# Patient Record
Sex: Male | Born: 1960 | Race: Black or African American | Hispanic: No | Marital: Single | Smoking: Never smoker
Health system: Southern US, Community
[De-identification: ages and names within clinical notes are randomized; demographics above are authoritative.]

---

## 2001-03-30 ENCOUNTER — Ambulatory Visit (HOSPITAL_COMMUNITY): Admission: RE | Admit: 2001-03-30 | Discharge: 2001-03-30 | Payer: Self-pay | Admitting: Family Medicine

## 2001-03-30 ENCOUNTER — Encounter: Payer: Self-pay | Admitting: Family Medicine

## 2012-12-08 ENCOUNTER — Encounter (INDEPENDENT_AMBULATORY_CARE_PROVIDER_SITE_OTHER): Payer: Self-pay | Admitting: *Deleted

## 2016-02-22 DIAGNOSIS — E785 Hyperlipidemia, unspecified: Secondary | ICD-10-CM | POA: Diagnosis not present

## 2016-02-22 DIAGNOSIS — R7301 Impaired fasting glucose: Secondary | ICD-10-CM | POA: Diagnosis not present

## 2016-02-22 DIAGNOSIS — Z008 Encounter for other general examination: Secondary | ICD-10-CM | POA: Diagnosis not present

## 2016-02-22 DIAGNOSIS — Z1389 Encounter for screening for other disorder: Secondary | ICD-10-CM | POA: Diagnosis not present

## 2016-06-12 DIAGNOSIS — Z23 Encounter for immunization: Secondary | ICD-10-CM | POA: Diagnosis not present

## 2016-07-16 DIAGNOSIS — Z008 Encounter for other general examination: Secondary | ICD-10-CM | POA: Diagnosis not present

## 2016-07-16 DIAGNOSIS — E785 Hyperlipidemia, unspecified: Secondary | ICD-10-CM | POA: Diagnosis not present

## 2016-07-16 DIAGNOSIS — R7301 Impaired fasting glucose: Secondary | ICD-10-CM | POA: Diagnosis not present

## 2016-07-16 DIAGNOSIS — Z7189 Other specified counseling: Secondary | ICD-10-CM | POA: Diagnosis not present

## 2016-10-01 DIAGNOSIS — E6609 Other obesity due to excess calories: Secondary | ICD-10-CM | POA: Diagnosis not present

## 2016-10-01 DIAGNOSIS — Z6832 Body mass index (BMI) 32.0-32.9, adult: Secondary | ICD-10-CM | POA: Diagnosis not present

## 2016-10-01 DIAGNOSIS — Z1389 Encounter for screening for other disorder: Secondary | ICD-10-CM | POA: Diagnosis not present

## 2016-10-01 DIAGNOSIS — Z0000 Encounter for general adult medical examination without abnormal findings: Secondary | ICD-10-CM | POA: Diagnosis not present

## 2016-10-01 DIAGNOSIS — Z23 Encounter for immunization: Secondary | ICD-10-CM | POA: Diagnosis not present

## 2016-11-25 DIAGNOSIS — R972 Elevated prostate specific antigen [PSA]: Secondary | ICD-10-CM | POA: Diagnosis not present

## 2016-12-26 DIAGNOSIS — E785 Hyperlipidemia, unspecified: Secondary | ICD-10-CM | POA: Diagnosis not present

## 2016-12-26 DIAGNOSIS — Z008 Encounter for other general examination: Secondary | ICD-10-CM | POA: Diagnosis not present

## 2016-12-26 DIAGNOSIS — Z719 Counseling, unspecified: Secondary | ICD-10-CM | POA: Diagnosis not present

## 2016-12-26 DIAGNOSIS — R7301 Impaired fasting glucose: Secondary | ICD-10-CM | POA: Diagnosis not present

## 2016-12-26 DIAGNOSIS — Z7189 Other specified counseling: Secondary | ICD-10-CM | POA: Diagnosis not present

## 2016-12-26 DIAGNOSIS — Z1389 Encounter for screening for other disorder: Secondary | ICD-10-CM | POA: Diagnosis not present

## 2017-07-22 DIAGNOSIS — E785 Hyperlipidemia, unspecified: Secondary | ICD-10-CM | POA: Diagnosis not present

## 2017-07-22 DIAGNOSIS — Z719 Counseling, unspecified: Secondary | ICD-10-CM | POA: Diagnosis not present

## 2017-07-22 DIAGNOSIS — Z7189 Other specified counseling: Secondary | ICD-10-CM | POA: Diagnosis not present

## 2017-07-22 DIAGNOSIS — R7301 Impaired fasting glucose: Secondary | ICD-10-CM | POA: Diagnosis not present

## 2017-07-22 DIAGNOSIS — Z008 Encounter for other general examination: Secondary | ICD-10-CM | POA: Diagnosis not present

## 2017-09-09 ENCOUNTER — Encounter: Payer: Self-pay | Admitting: Gastroenterology

## 2017-09-30 ENCOUNTER — Ambulatory Visit (INDEPENDENT_AMBULATORY_CARE_PROVIDER_SITE_OTHER): Payer: Self-pay

## 2017-09-30 DIAGNOSIS — Z1211 Encounter for screening for malignant neoplasm of colon: Secondary | ICD-10-CM

## 2017-09-30 MED ORDER — PEG 3350-KCL-NA BICARB-NACL 420 G PO SOLR: 4000.0000 mL | ORAL | 0 refills | Status: DC

## 2017-09-30 NOTE — Progress Notes (Signed)
Appropriate.

## 2017-09-30 NOTE — Progress Notes (Signed)
Gastroenterology Pre-Procedure Review  Request Date:09/30/17 Requesting Physician: Dr.Golding  PATIENT REVIEW QUESTIONS: The patient responded to the following health history questions as indicated:    1. Diabetes Melitis: no 2. Joint replacements in the past 12 months: no 3. Major health problems in the past 3 months: no 4. Has an artificial valve or MVP: no 5. Has a defibrillator: no 6. Has been advised in past to take antibiotics in advance of a procedure like teeth cleaning: no 7. Family history of colon cancer: no  8. Alcohol Use: yes (socially) 9. History of sleep apnea: no  10. History of coronary artery or other vascular stents placed within the last 12 months: no 11. History of any prior anesthesia complications: no    MEDICATIONS & ALLERGIES:    Patient reports the following regarding taking any blood thinners:   Plavix? no Aspirin? no Coumadin? no Brilinta? no Xarelto? no Eliquis? no Pradaxa? no Savaysa? no Effient? no  Patient confirms/reports the following medications:  Current Outpatient Medications  Medication Sig Dispense Refill  . Multiple Vitamin (MULTIVITAMIN) tablet Take 1 tablet by mouth daily.    . Omega-3 Fatty Acids (FISH OIL) 1000 MG CAPS Take by mouth.     No current facility-administered medications for this visit.     Patient confirms/reports the following allergies:  No Known Allergies  No orders of the defined types were placed in this encounter.   AUTHORIZATION INFORMATION Primary Insurance: BCBS [place]  ID #: [id number] Pre-Cert / Josem Kaufmann required: no   SCHEDULE INFORMATION: Procedure has been scheduled as follows:  Date: 11/05/17, Time: 9:30 Location: [facility] Dr.Rourk  This Gastroenterology Pre-Precedure Review Form is being routed to the following provider(s): Roseanne Kaufman NP

## 2017-09-30 NOTE — Patient Instructions (Signed)
Devin Bradley   04-05-1961 MRN: [id number]    Procedure Date: 11/05/17 Time to register: 8:30 Place to register: [facility] Procedure Time: 9:30 Scheduled provider: [facility] COLONOSCOPY WITH TRI-LYTE SPLIT PREP  Please notify us immediately if you are diabetic, take iron supplements, or if you are on Coumadin or any other blood thinners.     You will need to purchase 1 fleet enema and 1 box of Bisacodyl '5mg'$  tablets.   2 DAYS BEFORE PROCEDURE:  DATE: 11/03/17   DAY: Monday Begin clear liquid diet AFTER your lunch meal. NO SOLID FOODS after this point.  1 DAY BEFORE PROCEDURE:  DATE: 11/04/17   DAY: Tuesday Continue clear liquids the entire day - NO SOLID FOOD.    At 2:00 pm:  Take 2 Bisacodyl tablets.   At 4:00pm:  Start drinking your solution. Make sure you mix well per instructions on the bottle. Try to drink 1 (one) 8 ounce glass every 10-15 minutes until you have consumed HALF the jug. You should complete by 6:00pm.You must keep the left over solution refrigerated until completed next day.  Continue clear liquids. You must drink plenty of clear liquids to prevent dehyration and kidney failure. Nothing to eat or drink after midnight.  EXCEPTION: If you take medications for your heart, blood pressure or breathing, you may take these medications with a small amount of clear liquid.    DAY OF PROCEDURE:   DATE: 11/05/17  DAY: Wednesday   Five hours before your procedure time @ 4:30am:  Finish remaining amout of bowel prep, drinking 1 (one) 8 ounce glass every 10-15 minutes until complete. You have two hours to consume remaining prep.   Three hours before your procedure time '@6'$ :30am:  Nothing by mouth.   At least one hour before going to the hospital:  Give yourself one Fleet enema. You may take your morning medications with sip of water unless we have instructed otherwise.      Please see below for Dietary Information.  CLEAR LIQUIDS  INCLUDE:  Water Jello (NOT red in color)   Ice Popsicles (NOT red in color)   Tea (sugar ok, no milk/cream) Powdered fruit flavored drinks  Coffee (sugar ok, no milk/cream) Gatorade/ Lemonade/ Kool-Aid  (NOT red in color)   Juice: apple, white grape, white cranberry Soft drinks  Clear bullion, consomme, broth (fat free beef/chicken/vegetable)  Carbonated beverages (any kind)  Strained chicken noodle soup Hard Candy   Remember: Clear liquids are liquids that will allow you to see your fingers on the other side of a clear glass. Be sure liquids are NOT red in color, and not cloudy, but CLEAR.  DO NOT EAT OR DRINK ANY OF THE FOLLOWING:  Dairy products of any kind   Cranberry juice Tomato juice / V8 juice   Grapefruit juice Orange juice     Red grape juice  Do not eat any solid foods, including such foods as: cereal, oatmeal, yogurt, fruits, vegetables, creamed soups, eggs, bread, crackers, pureed foods in a blender, etc.   HELPFUL HINTS FOR DRINKING PREP SOLUTION:   Make sure prep is extremely cold. Mix and refrigerate the the morning of the prep. You may also put in the freezer.   You may try mixing some Crystal Light or Country Time Lemonade if you prefer. Mix in small amounts; add more if necessary.  Try drinking through a straw  Rinse mouth with water or a mouthwash between glasses, to remove after-taste.  Try sipping on a cold beverage /ice/ popsicles between glasses of prep.  Place a piece of sugar-free hard candy in mouth between glasses.  If you become nauseated, try consuming smaller amounts, or stretch out the time between glasses. Stop for 30-60 minutes, then slowly start back drinking.        OTHER INSTRUCTIONS  You will need a responsible adult at least 56 years of age to accompany you and drive you home. This person must remain in the waiting room during your procedure. The hospital will cancel your procedure if you do not have a responsible adult with you.    1. Wear loose fitting clothing that is easily removed. 2. Leave jewelry and other valuables at home.  3. Remove all body piercing jewelry and leave at home. 4. Total time from sign-in until discharge is approximately 2-3 hours. 5. You should go home directly after your procedure and rest. You can resume normal activities the day after your procedure. 6. The day of your procedure you should not:  Drive  Make legal decisions  Operate machinery  Drink alcohol  Return to work   You may call the office (Dept: [phone]) before 5:00pm, or page the doctor on call [phone]) after 5:00pm, for further instructions, if necessary.   Insurance Information YOU WILL NEED TO CHECK WITH YOUR INSURANCE COMPANY FOR THE BENEFITS OF COVERAGE YOU HAVE FOR THIS PROCEDURE.  UNFORTUNATELY, NOT ALL INSURANCE COMPANIES HAVE BENEFITS TO COVER ALL OR PART OF THESE TYPES OF PROCEDURES.  IT IS YOUR RESPONSIBILITY TO CHECK YOUR BENEFITS, HOWEVER, WE WILL BE GLAD TO ASSIST YOU WITH ANY CODES YOUR INSURANCE COMPANY MAY NEED.    PLEASE NOTE THAT MOST INSURANCE COMPANIES WILL NOT COVER A SCREENING COLONOSCOPY FOR PEOPLE UNDER THE AGE OF 50  IF YOU HAVE BCBS INSURANCE, YOU MAY HAVE BENEFITS FOR A SCREENING COLONOSCOPY BUT IF POLYPS ARE FOUND THE DIAGNOSIS WILL CHANGE AND THEN YOU MAY HAVE A DEDUCTIBLE THAT WILL NEED TO BE MET. SO PLEASE MAKE SURE YOU CHECK YOUR BENEFITS FOR A SCREENING COLONOSCOPY AS WELL AS A DIAGNOSTIC COLONOSCOPY.

## 2017-11-04 ENCOUNTER — Telehealth: Payer: Self-pay | Admitting: Internal Medicine

## 2017-11-04 NOTE — Telephone Encounter (Signed)
Please call patient, he has a procedure tomorrow and stated he ate a granola bar

## 2017-11-04 NOTE — Telephone Encounter (Signed)
Pt is aware. He said that was the only thing other than clear liquids that he has had. He said he understood his instructions and would follow them exactly tonight. He will not have any more solid foods.

## 2017-11-04 NOTE — Telephone Encounter (Signed)
Tried to call pt- NA- LMOM 

## 2017-11-04 NOTE — Telephone Encounter (Signed)
Called patient but no answer. Patient was triaged by Almyra Free. Does he need to reschedule his TCS Almyra Free?

## 2017-11-04 NOTE — Telephone Encounter (Signed)
Per RMR- as long as pt does not eat any more solid foods and follows his prep exactly, he will still be able to do his procedure tomorrow.

## 2017-11-05 ENCOUNTER — Encounter (HOSPITAL_COMMUNITY): Payer: Self-pay | Admitting: *Deleted

## 2017-11-05 ENCOUNTER — Encounter (HOSPITAL_COMMUNITY)
Admission: RE | Disposition: A | Discharge status: Home / Self Care | Payer: Self-pay | Source: Ambulatory Visit | Attending: Internal Medicine

## 2017-11-05 ENCOUNTER — Ambulatory Visit (HOSPITAL_COMMUNITY)
Admission: RE | Admit: 2017-11-05 | Discharge: 2017-11-05 | Disposition: A | Discharge status: Home / Self Care | Payer: BLUE CROSS/BLUE SHIELD | Source: Ambulatory Visit | Attending: Internal Medicine | Admitting: Internal Medicine

## 2017-11-05 ENCOUNTER — Other Ambulatory Visit: Payer: Self-pay

## 2017-11-05 DIAGNOSIS — D125 Benign neoplasm of sigmoid colon: Secondary | ICD-10-CM | POA: Insufficient documentation

## 2017-11-05 DIAGNOSIS — Z1211 Encounter for screening for malignant neoplasm of colon: Secondary | ICD-10-CM | POA: Insufficient documentation

## 2017-11-05 DIAGNOSIS — K5730 Diverticulosis of large intestine without perforation or abscess without bleeding: Secondary | ICD-10-CM | POA: Insufficient documentation

## 2017-11-05 DIAGNOSIS — Z1212 Encounter for screening for malignant neoplasm of rectum: Secondary | ICD-10-CM | POA: Diagnosis not present

## 2017-11-05 HISTORY — PX: COLONOSCOPY: SHX5424

## 2017-11-05 HISTORY — PX: POLYPECTOMY: SHX5525

## 2017-11-05 SURGERY — COLONOSCOPY
Anesthesia: Moderate Sedation

## 2017-11-05 MED ORDER — SODIUM CHLORIDE 0.9 % IV SOLN
INTRAVENOUS | Status: DC
  Administered 2017-11-05: 1000 mL via INTRAVENOUS

## 2017-11-05 MED ORDER — MEPERIDINE HCL 100 MG/ML IJ SOLN
INTRAMUSCULAR | Status: DC | PRN
  Administered 2017-11-05 (×2): 50 mg

## 2017-11-05 MED ORDER — ONDANSETRON HCL 4 MG/2ML IJ SOLN
INTRAMUSCULAR | Status: AC
  Filled 2017-11-05: qty 2

## 2017-11-05 MED ORDER — STERILE WATER FOR IRRIGATION IR SOLN
Status: DC | PRN
  Administered 2017-11-05: 09:00:00

## 2017-11-05 MED ORDER — MIDAZOLAM HCL 5 MG/5ML IJ SOLN
INTRAMUSCULAR | Status: AC
  Filled 2017-11-05: qty 10

## 2017-11-05 MED ORDER — ONDANSETRON HCL 4 MG/2ML IJ SOLN
INTRAMUSCULAR | Status: DC | PRN
  Administered 2017-11-05: 4 mg via INTRAVENOUS

## 2017-11-05 MED ORDER — MEPERIDINE HCL 100 MG/ML IJ SOLN
INTRAMUSCULAR | Status: AC
  Filled 2017-11-05: qty 2

## 2017-11-05 MED ORDER — MIDAZOLAM HCL 5 MG/5ML IJ SOLN
INTRAMUSCULAR | Status: DC | PRN
  Administered 2017-11-05: 2 mg via INTRAVENOUS
  Administered 2017-11-05: 1 mg via INTRAVENOUS
  Administered 2017-11-05: 2 mg via INTRAVENOUS

## 2017-11-05 NOTE — H&P (Signed)
@  PRFF@   Primary Care Physician:  Sharilyn Sites, MD Primary Gastroenterologist:  Dr. Gala Romney  Pre-Procedure History & Physical: HPI:  Devin Bradley is a 57 y.o. male is here for a screening colonoscopy. No bowel symptoms. No family history of colon cancer. A prior colonoscopy.  History reviewed. No pertinent past medical history.  History reviewed. No pertinent surgical history.  Prior to Admission medications   Medication Sig Start Date End Date Taking? Authorizing Provider  Multiple Vitamin (MULTIVITAMIN) tablet Take 1 tablet by mouth daily.   Yes [provider]  Omega-3 Fatty Acids (FISH OIL) 1000 MG CAPS Take 1 capsule by mouth daily.    Yes [provider]    Allergies as of 09/30/2017  . (No Known Allergies)    Family History  Problem Relation Age of Onset  . Cancer Father   . Cancer Sister   . Cancer Brother     Social History   Socioeconomic History  . Marital status: Single    Spouse name: Not on file  . Number of children: Not on file  . Years of education: Not on file  . Highest education level: Not on file  Social Needs  . Financial resource strain: Not on file  . Food insecurity - worry: Not on file  . Food insecurity - inability: Not on file  . Transportation needs - medical: Not on file  . Transportation needs - non-medical: Not on file  Occupational History  . Not on file  Tobacco Use  . Smoking status: Never Smoker  . Smokeless tobacco: Never Used  Substance and Sexual Activity  . Alcohol use: Yes    Comment: occasional wine  . Drug use: No  . Sexual activity: Not on file  Other Topics Concern  . Not on file  Social History Narrative  . Not on file    Review of Systems: See HPI, otherwise negative ROS  Physical Exam: BP (!) 141/88   Pulse 78   Temp (!) 97.5 F (36.4 C) (Oral)   Resp 16   Ht 6' 3.5" (1.918 m)   Wt 250 lb (113.4 kg)   SpO2 100%   BMI 30.84 kg/m  General:   Alert,  Well-developed, well-nourished,  pleasant and cooperative in NAD Neck:  Supple; no masses or thyromegaly. Lungs:  Clear throughout to auscultation.   No wheezes, crackles, or rhonchi. No acute distress. Heart:  Regular rate and rhythm; no murmurs, clicks, rubs,  or gallops. Abdomen:  Soft, nontender and nondistended. No masses, hepatosplenomegaly or hernias noted. Normal bowel sounds, without guarding, and without rebound.    Impression/Plan: Devin Bradley is now here to undergo a screening colonoscopy.  First ever average risk screening examination.  Risks, benefits, limitations, imponderables and alternatives regarding colonoscopy have been reviewed with the patient. Questions have been answered. All parties agreeable.     Notice:  This dictation was prepared with Dragon dictation along with smaller phrase technology. Any transcriptional errors that result from this process are unintentional and may not be corrected upon review.

## 2017-11-05 NOTE — Discharge Instructions (Signed)
°Colonoscopy °Discharge Instructions ° °Read the instructions outlined below and refer to this sheet in the next few weeks. These discharge instructions provide you with general information on caring for yourself after you leave the hospital. Your doctor may also give you specific instructions. While your treatment has been planned according to the most current medical practices available, unavoidable complications occasionally occur. If you have any problems or questions after discharge, call Dr. Rourk at [phone]. °ACTIVITY °· You may resume your regular activity, but move at a slower pace for the next 24 hours.  °· Take frequent rest periods for the next 24 hours.  °· Walking will help get rid of the air and reduce the bloated feeling in your belly (abdomen).  °· No driving for 24 hours (because of the medicine (anesthesia) used during the test).   °· Do not sign any important legal documents or operate any machinery for 24 hours (because of the anesthesia used during the test).  °NUTRITION °· Drink plenty of fluids.  °· You may resume your normal diet as instructed by your doctor.  °· Begin with a light meal and progress to your normal diet. Heavy or fried foods are harder to digest and may make you feel sick to your stomach (nauseated).  °· Avoid alcoholic beverages for 24 hours or as instructed.  °MEDICATIONS °· You may resume your normal medications unless your doctor tells you otherwise.  °WHAT YOU CAN EXPECT TODAY °· Some feelings of bloating in the abdomen.  °· Passage of more gas than usual.  °· Spotting of blood in your stool or on the toilet paper.  °IF YOU HAD POLYPS REMOVED DURING THE COLONOSCOPY: °· No aspirin products for 7 days or as instructed.  °· No alcohol for 7 days or as instructed.  °· Eat a soft diet for the next 24 hours.  °FINDING OUT THE RESULTS OF YOUR TEST °Not all test results are available during your visit. If your test results are not back during the visit, make an appointment  with your caregiver to find out the results. Do not assume everything is normal if you have not heard from your caregiver or the medical facility. It is important for you to follow up on all of your test results.  °SEEK IMMEDIATE MEDICAL ATTENTION IF: °· You have more than a spotting of blood in your stool.  °· Your belly is swollen (abdominal distention).  °· You are nauseated or vomiting.  °· You have a temperature over 101.  °· You have abdominal pain or discomfort that is severe or gets worse throughout the day.  ° ° °Colon polyp and diverticulosis information provided ° °Further recommendations to follow pending review of pathology report ° ° °Colon Polyps °Polyps are tissue growths inside the body. Polyps can grow in many places, including the large intestine (colon). A polyp may be a round bump or a mushroom-shaped growth. You could have one polyp or several. °Most colon polyps are noncancerous (benign). However, some colon polyps can become cancerous over time. °What are the causes? °The exact cause of colon polyps is not known. °What increases the risk? °This condition is more likely to develop in people who: °· Have a family history of colon cancer or colon polyps. °· Are older than 50 or older than 45 if they are African American. °· Have inflammatory bowel disease, such as ulcerative colitis or Crohn disease. °· Are overweight. °· Smoke cigarettes. °· Do not get enough exercise. °· Drink too   much alcohol. °· Eat a diet that is: °? High in fat and red meat. °? Low in fiber. °· Had childhood cancer that was treated with abdominal radiation. ° °What are the signs or symptoms? °Most polyps do not cause symptoms. If you have symptoms, they may include: °· Blood coming from your rectum when having a bowel movement. °· Blood in your stool. The stool may look dark red or black. °· A change in bowel habits, such as constipation or diarrhea. ° °How is this diagnosed? °This condition is diagnosed with a  colonoscopy. This is a procedure that uses a lighted, flexible scope to look at the inside of your colon. °How is this treated? °Treatment for this condition involves removing any polyps that are found. Those polyps will then be tested for cancer. If cancer is found, your health care provider will talk to you about options for colon cancer treatment. °Follow these instructions at home: °Diet °· Eat plenty of fiber, such as fruits, vegetables, and whole grains. °· Eat foods that are high in calcium and vitamin D, such as milk, cheese, yogurt, eggs, liver, fish, and broccoli. °· Limit foods high in fat, red meats, and processed meats, such as hot dogs, sausage, bacon, and lunch meats. °· Maintain a healthy weight, or lose weight if recommended by your health care provider. °General instructions °· Do not smoke cigarettes. °· Do not drink alcohol excessively. °· Keep all follow-up visits as told by your health care provider. This is important. This includes keeping regularly scheduled colonoscopies. Talk to your health care provider about when you need a colonoscopy. °· Exercise every day or as told by your health care provider. °Contact a health care provider if: °· You have new or worsening bleeding during a bowel movement. °· You have new or increased blood in your stool. °· You have a change in bowel habits. °· You unexpectedly lose weight. °This information is not intended to replace advice given to you by your health care provider. Make sure you discuss any questions you have with your health care provider. °Document Released: 05/15/2004 Document Revised: 01/25/2016 Document Reviewed: 07/10/2015 °Elsevier Interactive Patient Education © 2018 [employer] ° ° °Diverticulosis °Diverticulosis is a condition that develops when small pouches (diverticula) form in the wall of the large intestine (colon). The colon is where water is absorbed and stool is formed. The pouches form when the inside layer of the colon  pushes through weak spots in the outer layers of the colon. You may have a few pouches or many of them. °What are the causes? °The cause of this condition is not known. °What increases the risk? °The following factors may make you more likely to develop this condition: °· Being older than age 60. Your risk for this condition increases with age. Diverticulosis is rare among people younger than age 30. By age 80, many people have it. °· Eating a low-fiber diet. °· Having frequent constipation. °· Being overweight. °· Not getting enough exercise. °· Smoking. °· Taking over-the-counter pain medicines, like aspirin and ibuprofen. °· Having a family history of diverticulosis. ° °What are the signs or symptoms? °In most people, there are no symptoms of this condition. If you do have symptoms, they may include: °· Bloating. °· Cramps in the abdomen. °· Constipation or diarrhea. °· Pain in the lower left side of the abdomen. ° °How is this diagnosed? °This condition is most often diagnosed during an exam for other colon problems. Because diverticulosis usually has no   symptoms, it often cannot be diagnosed independently. This condition may be diagnosed by: °· Using a flexible scope to examine the colon (colonoscopy). °· Taking an X-ray of the colon after dye has been put into the colon (barium enema). °· Doing a CT scan. ° °How is this treated? °You may not need treatment for this condition if you have never developed an infection related to diverticulosis. If you have had an infection before, treatment may include: °· Eating a high-fiber diet. This may include eating more fruits, vegetables, and grains. °· Taking a fiber supplement. °· Taking a live bacteria supplement (probiotic). °· Taking medicine to relax your colon. °· Taking antibiotic medicines. ° °Follow these instructions at home: °· Drink 6-8 glasses of water or more each day to prevent constipation. °· Try not to strain when you have a bowel movement. °· If you  have had an infection before: °? Eat more fiber as directed by your health care provider or your diet and nutrition specialist (dietitian). °? Take a fiber supplement or probiotic, if your health care provider approves. °· Take over-the-counter and prescription medicines only as told by your health care provider. °· If you were prescribed an antibiotic, take it as told by your health care provider. Do not stop taking the antibiotic even if you start to feel better. °· Keep all follow-up visits as told by your health care provider. This is important. °Contact a health care provider if: °· You have pain in your abdomen. °· You have bloating. °· You have cramps. °· You have not had a bowel movement in 3 days. °Get help right away if: °· Your pain gets worse. °· Your bloating becomes very bad. °· You have a fever or chills, and your symptoms suddenly get worse. °· You vomit. °· You have bowel movements that are bloody or black. °· You have bleeding from your rectum. °Summary °· Diverticulosis is a condition that develops when small pouches (diverticula) form in the wall of the large intestine (colon). °· You may have a few pouches or many of them. °· This condition is most often diagnosed during an exam for other colon problems. °· If you have had an infection related to diverticulosis, treatment may include increasing the fiber in your diet, taking supplements, or taking medicines. °This information is not intended to replace advice given to you by your health care provider. Make sure you discuss any questions you have with your health care provider. °Document Released: 05/16/2004 Document Revised: 07/08/2016 Document Reviewed: 07/08/2016 °Elsevier Interactive Patient Education © 2017 [employer] ° °

## 2017-11-05 NOTE — Op Note (Signed)
[facility] Patient Name: Devin Bradley Procedure Date: 11/05/2017 8:47 AM MRN: [id number] Date of Birth: 01-03-1961 Attending MD: Norvel Richards , MD CSN: [id number] Age: 57 Admit Type: Outpatient Procedure:                Colonoscopy Indications:              Screening for colorectal malignant neoplasm Providers:                Norvel Richards, MD, Lurline Del, RN, Aram Candela Referring MD:              Medicines:                Midazolam 5 mg IV, Meperidine 765 mg IV Complications:            No immediate complications. Estimated Blood Loss:     Estimated blood loss was minimal. Procedure:                Pre-Anesthesia Assessment:                           - Prior to the procedure, a History and Physical                            was performed, and patient medications and                            allergies were reviewed. The patient's tolerance of                            previous anesthesia was also reviewed. The risks                            and benefits of the procedure and the sedation                            options and risks were discussed with the patient.                            All questions were answered, and informed consent                            was obtained. Prior Anticoagulants: The patient has                            taken no previous anticoagulant or antiplatelet                            agents. ASA Grade Assessment: II - A patient with                            mild systemic disease. After reviewing the risks  and benefits, the patient was deemed in                            satisfactory condition to undergo the procedure.                           After obtaining informed consent, the colonoscope                            was passed under direct vision. Throughout the                            procedure, the patient's blood pressure, pulse, and   oxygen saturations were monitored continuously. The                            [id number]-i10L [id number]) scope was introduced through                            the anus and advanced to the the cecum, identified                            by appendiceal orifice and ileocecal valve. The                            colonoscopy was performed without difficulty. The                            patient tolerated the procedure well. The quality                            of the bowel preparation was adequate. The                            ileocecal valve, appendiceal orifice, and rectum                            were photographed. The entire colon was well                            visualized. Scope In: 9:13:55 AM Scope Out: 9:27:14 AM Scope Withdrawal Time: 0 hours 9 minutes 2 seconds  Total Procedure Duration: 0 hours 13 minutes 19 seconds  Findings:      The perianal and digital rectal examinations were normal.      A few small-mouthed diverticula were found in the sigmoid colon.      A 6 mm polyp was found in the sigmoid colon. The polyp was pedunculated.       The polyp was removed with a cold snare. Resection and retrieval were       complete. Estimated blood loss was minimal.      The entire examined colon appeared normal on direct and retroflexion       views. Impression:               - Diverticulosis in the sigmoid colon.                           -  One 6 mm polyp in the sigmoid colon, removed with                            a cold snare. Resected and retrieved.                           - The entire examined colon is normal on direct and                            retroflexion views. Moderate Sedation:      Moderate (conscious) sedation was administered by the endoscopy nurse       and supervised by the endoscopist. The following parameters were       monitored: oxygen saturation, heart rate, blood pressure, respiratory       rate, EKG, adequacy of pulmonary ventilation, and response  to care. Recommendation:           - Patient has a contact number available for                            emergencies. The signs and symptoms of potential                            delayed complications were discussed with the                            patient. Return to normal activities tomorrow.                            Written discharge instructions were provided to the                            patient.                           - Resume previous diet.                           - Continue present medications.                           - Await pathology results.                           - Repeat colonoscopy date to be determined after                            pending pathology results are reviewed for                            surveillance.                           - Return to GI clinic (date not yet determined). Procedure Code(s):        --- Professional ---  45385, Colonoscopy, flexible; with removal of                            tumor(s), polyp(s), or other lesion(s) by snare                            technique Diagnosis Code(s):        --- Professional ---                           Z12.11, Encounter for screening for malignant                            neoplasm of colon                           D12.5, Benign neoplasm of sigmoid colon                           K57.30, Diverticulosis of large intestine without                            perforation or abscess without bleeding CPT copyright 2016 American Medical Association. All rights reserved. The codes documented in this report are preliminary and upon coder review may  be revised to meet current compliance requirements. Cristopher Estimable. Olie Dibert, MD Norvel Richards, MD 11/05/2017 9:31:45 AM This report has been signed electronically. Number of Addenda: 0

## 2017-11-06 ENCOUNTER — Encounter: Payer: Self-pay | Admitting: Internal Medicine

## 2017-11-07 ENCOUNTER — Encounter (HOSPITAL_COMMUNITY): Payer: Self-pay | Admitting: Internal Medicine

## 2018-01-20 DIAGNOSIS — R7301 Impaired fasting glucose: Secondary | ICD-10-CM | POA: Diagnosis not present

## 2018-01-20 DIAGNOSIS — Z008 Encounter for other general examination: Secondary | ICD-10-CM | POA: Diagnosis not present

## 2018-01-20 DIAGNOSIS — Z719 Counseling, unspecified: Secondary | ICD-10-CM | POA: Diagnosis not present

## 2018-01-20 DIAGNOSIS — E785 Hyperlipidemia, unspecified: Secondary | ICD-10-CM | POA: Diagnosis not present

## 2018-01-21 DIAGNOSIS — Z1389 Encounter for screening for other disorder: Secondary | ICD-10-CM | POA: Diagnosis not present

## 2018-01-21 DIAGNOSIS — Z6834 Body mass index (BMI) 34.0-34.9, adult: Secondary | ICD-10-CM | POA: Diagnosis not present

## 2018-01-21 DIAGNOSIS — E6609 Other obesity due to excess calories: Secondary | ICD-10-CM | POA: Diagnosis not present

## 2018-01-21 DIAGNOSIS — Z0000 Encounter for general adult medical examination without abnormal findings: Secondary | ICD-10-CM | POA: Diagnosis not present

## 2018-06-08 DIAGNOSIS — Z23 Encounter for immunization: Secondary | ICD-10-CM | POA: Diagnosis not present

## 2018-06-08 DIAGNOSIS — Z6835 Body mass index (BMI) 35.0-35.9, adult: Secondary | ICD-10-CM | POA: Diagnosis not present

## 2018-06-08 DIAGNOSIS — R21 Rash and other nonspecific skin eruption: Secondary | ICD-10-CM | POA: Diagnosis not present

## 2018-06-08 DIAGNOSIS — Z1389 Encounter for screening for other disorder: Secondary | ICD-10-CM | POA: Diagnosis not present

## 2018-06-08 DIAGNOSIS — L299 Pruritus, unspecified: Secondary | ICD-10-CM | POA: Diagnosis not present

## 2018-07-16 DIAGNOSIS — Z008 Encounter for other general examination: Secondary | ICD-10-CM | POA: Diagnosis not present

## 2018-07-16 DIAGNOSIS — Z719 Counseling, unspecified: Secondary | ICD-10-CM | POA: Diagnosis not present

## 2018-07-16 DIAGNOSIS — E785 Hyperlipidemia, unspecified: Secondary | ICD-10-CM | POA: Diagnosis not present

## 2018-07-16 DIAGNOSIS — R7301 Impaired fasting glucose: Secondary | ICD-10-CM | POA: Diagnosis not present

## 2018-11-02 ENCOUNTER — Emergency Department (HOSPITAL_COMMUNITY)
Admission: EM | Admit: 2018-11-02 | Discharge: 2018-11-02 | Disposition: A | Discharge status: Home / Self Care | Payer: No Typology Code available for payment source | Attending: Emergency Medicine | Admitting: Emergency Medicine

## 2018-11-02 ENCOUNTER — Encounter (HOSPITAL_COMMUNITY): Payer: Self-pay | Admitting: Emergency Medicine

## 2018-11-02 ENCOUNTER — Ambulatory Visit: Payer: Self-pay | Admitting: Orthopedic Surgery

## 2018-11-02 ENCOUNTER — Emergency Department (HOSPITAL_COMMUNITY): Payer: No Typology Code available for payment source

## 2018-11-02 ENCOUNTER — Other Ambulatory Visit: Payer: Self-pay

## 2018-11-02 DIAGNOSIS — S42401S Unspecified fracture of lower end of right humerus, sequela: Secondary | ICD-10-CM | POA: Diagnosis not present

## 2018-11-02 DIAGNOSIS — Y939 Activity, unspecified: Secondary | ICD-10-CM | POA: Insufficient documentation

## 2018-11-02 DIAGNOSIS — S42495A Other nondisplaced fracture of lower end of left humerus, initial encounter for closed fracture: Secondary | ICD-10-CM | POA: Insufficient documentation

## 2018-11-02 DIAGNOSIS — Y990 Civilian activity done for income or pay: Secondary | ICD-10-CM | POA: Insufficient documentation

## 2018-11-02 DIAGNOSIS — W010XXA Fall on same level from slipping, tripping and stumbling without subsequent striking against object, initial encounter: Secondary | ICD-10-CM | POA: Diagnosis not present

## 2018-11-02 DIAGNOSIS — Z79899 Other long term (current) drug therapy: Secondary | ICD-10-CM | POA: Insufficient documentation

## 2018-11-02 DIAGNOSIS — R52 Pain, unspecified: Secondary | ICD-10-CM | POA: Diagnosis not present

## 2018-11-02 DIAGNOSIS — W19XXXA Unspecified fall, initial encounter: Secondary | ICD-10-CM | POA: Diagnosis not present

## 2018-11-02 DIAGNOSIS — Y929 Unspecified place or not applicable: Secondary | ICD-10-CM | POA: Insufficient documentation

## 2018-11-02 DIAGNOSIS — S59902A Unspecified injury of left elbow, initial encounter: Secondary | ICD-10-CM

## 2018-11-02 DIAGNOSIS — T148XXA Other injury of unspecified body region, initial encounter: Secondary | ICD-10-CM

## 2018-11-02 MED ORDER — HYDROCODONE-ACETAMINOPHEN 5-325 MG PO TABS: ORAL_TABLET | ORAL | 0 refills | Status: DC

## 2018-11-02 MED ORDER — OXYCODONE-ACETAMINOPHEN 5-325 MG PO TABS
1.0000 | ORAL_TABLET | Freq: Once | ORAL | Status: AC
  Administered 2018-11-02: 1 via ORAL
  Filled 2018-11-02: qty 1

## 2018-11-02 NOTE — Discharge Instructions (Addendum)
Elevate and apply ice packs on and off to your elbow.  Keep it in the sling.  Follow-up with Dr. Ruthe Mannan office on Wednesday, 11/04/2018 at 3 PM.

## 2018-11-02 NOTE — ED Triage Notes (Signed)
PT brought in by [facility] and states he fell at work this am at [employer] and landed with his arm in behind him. PT c/o left forearm and elbow pain upon arrival to ED.

## 2018-11-02 NOTE — ED Provider Notes (Signed)
[facility] EMERGENCY DEPARTMENT Provider Note   CSN: [id number] Arrival date & time: 11/02/18  1237    History   Chief Complaint Chief Complaint  Patient presents with  . Fall    HPI Devin Bradley is a 58 y.o. male.     HPI   Devin Bradley is a 58 y.o. male who presents to the Emergency Department complaining of sudden onset of left elbow pain and swelling after a mechanical fall that occurred at work.  He states this is a workers comp injury.  He describes a mechanical fall in which he slipped and fell with his left arm extended behind him.  He reports diffuse pain and swelling of the elbow and pain with flexion.  He denies head injury, LOC, neck or back pain, numbness or tingling of his fingers or wrist pain.  Right-hand dominant.  History reviewed. No pertinent past medical history.  There are no active problems to display for this patient.   Past Surgical History:  Procedure Laterality Date  . COLONOSCOPY N/A 11/05/2017   Procedure: COLONOSCOPY;  Surgeon: Daneil Dolin, MD;  [facility]: [facility];  Service: Endoscopy;  Laterality: N/A;  9:30  . POLYPECTOMY  11/05/2017   Procedure: POLYPECTOMY;  Surgeon: Daneil Dolin, MD;  Location: [facility];  Service: Endoscopy;;  sigmoid        Home Medications    Prior to Admission medications   Medication Sig Start Date End Date Taking? Authorizing Provider  Multiple Vitamin (MULTIVITAMIN) tablet Take 1 tablet by mouth daily.    [provider]  Omega-3 Fatty Acids (FISH OIL) 1000 MG CAPS Take 1 capsule by mouth daily.     [provider]    Family History Family History  Problem Relation Age of Onset  . Cancer Father   . Cancer Sister   . Cancer Brother     Social History Social History   Tobacco Use  . Smoking status: Never Smoker  . Smokeless tobacco: Never Used  Substance Use Topics  . Alcohol use: Yes    Comment: occasional wine  . Drug use: No     Allergies   Patient has no  known allergies.   Review of Systems Review of Systems  Constitutional: Negative for chills and fever.  Respiratory: Negative for shortness of breath.   Cardiovascular: Negative for chest pain.  Gastrointestinal: Negative for nausea and vomiting.  Musculoskeletal: Positive for arthralgias (Left elbow pain and swelling) and joint swelling. Negative for back pain and neck pain.  Skin: Negative for color change and wound.  Neurological: Negative for dizziness, weakness and numbness.     Physical Exam Updated Vital Signs BP (!) 158/91 (BP Location: Right Arm)   Pulse 74   Temp 98 F (36.7 C) (Oral)   Resp 18   Ht 6\' 4"  (1.93 m)   Wt 126.1 kg   SpO2 100%   BMI 33.84 kg/m   Physical Exam Vitals signs and nursing note reviewed.  Constitutional:      General: He is not in acute distress.    Appearance: Normal appearance.  HENT:     Head: Atraumatic.  Neck:     Musculoskeletal: Normal range of motion. No muscular tenderness.  Cardiovascular:     Rate and Rhythm: Normal rate and regular rhythm.     Pulses: Normal pulses.  Pulmonary:     Effort: Pulmonary effort is normal.     Breath sounds: Normal breath sounds.  Chest:  Chest wall: No tenderness.  Musculoskeletal:        General: Swelling, tenderness and signs of injury present.     Comments: Diffuse tenderness to palpation of the left elbow.  Moderate edema noted.  Patient holding arm in extension and guarding elbow with movement.  Bony deformity.  Left wrist and shoulder are nontender.  Grip strength is strong and symmetrical.  Skin:    General: Skin is warm.     Capillary Refill: Capillary refill takes less than 2 seconds.     Findings: No rash.  Neurological:     General: No focal deficit present.     Mental Status: He is alert.     Sensory: Sensation is intact. No sensory deficit.     Motor: No weakness.      ED Treatments / Results  Labs (all labs ordered are listed, but only abnormal results are  displayed) Labs Reviewed - No data to display  EKG None  Radiology Dg Elbow Complete Left  Result Date: 11/02/2018 CLINICAL DATA:  Fall.  Left elbow pain. EXAM: LEFT ELBOW - COMPLETE 3+ VIEW COMPARISON:  None. FINDINGS: Small linear bone fragment near the capitellum and radial head. Findings are suggestive for an avulsion fracture. There is some irregularity along the lateral aspect of the capitellum suggesting this could be the source of the fracture. No large joint effusion. The elbow is located. IMPRESSION: Evidence for an avulsion fracture near the capitellum and radial head. Suspect that the fracture is originating from the lateral aspect of the capitellum. Electronically Signed   By: Markus Daft M.D.   On: 11/02/2018 13:35    Procedures Procedures (including critical care time)  Medications Ordered in ED Medications  oxyCODONE-acetaminophen (PERCOCET/ROXICET) 5-325 MG per tablet 1 tablet (1 tablet Oral Given 11/02/18 1407)     Initial Impression / Assessment and Plan / ED Course  I have reviewed the triage vital signs and the nursing notes.  Pertinent labs & imaging results that were available during my care of the patient were reviewed by me and considered in my medical decision making (see chart for details).         X-rays show avulsion fracture of the elbow, likely originating from the capitellum.  Patient neurovascularly intact.  Compartments are soft.  Patient requesting follow-up with orthopedics locally and to see Dr. Aline Brochure.  1425 Dr. Aline Brochure is not on-call today,  I have spoken with Angie at Dr. Ruthe Mannan office, they will see patient for follow-up this Wednesday, 11/04/2018 at 3 PM. Patient placed in a sling, he agrees to elevate and ice.  Plans also discussed with [employer] nurse on staff.  Final Clinical Impressions(s) / ED Diagnoses   Final diagnoses:  Avulsion fracture  Injury of left elbow, initial encounter    ED Discharge Orders    None         Kem Parkinson, PA-C 11/02/18 1444    Elnora Morrison, MD 11/03/18 [phone]

## 2018-11-04 ENCOUNTER — Encounter: Payer: Self-pay | Admitting: Orthopedic Surgery

## 2018-11-04 ENCOUNTER — Ambulatory Visit (INDEPENDENT_AMBULATORY_CARE_PROVIDER_SITE_OTHER): Payer: No Typology Code available for payment source | Admitting: Orthopedic Surgery

## 2018-11-04 ENCOUNTER — Other Ambulatory Visit: Payer: Self-pay

## 2018-11-04 VITALS — BP 188/103 | HR 78 | Ht 75.0 in | Wt 275.0 lb

## 2018-11-04 DIAGNOSIS — S53442A Ulnar collateral ligament sprain of left elbow, initial encounter: Secondary | ICD-10-CM

## 2018-11-04 DIAGNOSIS — S42452A Displaced fracture of lateral condyle of left humerus, initial encounter for closed fracture: Secondary | ICD-10-CM

## 2018-11-04 MED ORDER — HYDROCODONE-ACETAMINOPHEN 5-325 MG PO TABS: 1.0000 | ORAL_TABLET | Freq: Four times a day (QID) | ORAL | 0 refills | Status: AC | PRN

## 2018-11-04 MED ORDER — IBUPROFEN 800 MG PO TABS: 800.0000 mg | ORAL_TABLET | Freq: Three times a day (TID) | ORAL | 0 refills | Status: DC | PRN

## 2018-11-04 NOTE — Patient Instructions (Signed)
NO USE LEFT ARM 3 WEEKS

## 2018-11-04 NOTE — Progress Notes (Signed)
NEW PATIENT OFFICE VISIT  Chief Complaint  Patient presents with  . Elbow Injury    left elbow injury 11/02/18 fell slipped at work ([employer])     58 year old male was at work landed on his left arm and elbow presented to the ER on 2 March complaining of left elbow pain x-ray showed a small avulsion fraction of the capitellum  He complains of left elbow pain and swelling medial lateral joint some loss of motion and his sling is not holding his arm well and he stopped using it.  He is on hydrocodone for pain  He has some swelling on the medial side a lot of bruising and some lateral pain  Mild discomfort currently initially had severe 10 out of 10 pain   Review of Systems  Constitutional: Negative for fever.  Skin: Negative.   Neurological: Negative for tingling and focal weakness.     History reviewed. No pertinent past medical history.  Past Surgical History:  Procedure Laterality Date  . COLONOSCOPY N/A 11/05/2017   Procedure: COLONOSCOPY;  Surgeon: Daneil Dolin, MD;  [facility];  Service: Endoscopy;  Laterality: N/A;  9:30  . POLYPECTOMY  11/05/2017   Procedure: POLYPECTOMY;  Surgeon: Daneil Dolin, MD;  [facility]: [facility];  Service: Endoscopy;;  sigmoid    Family History  Problem Relation Age of Onset  . Cancer Father   . Cancer Sister   . Cancer Brother    Social History   Tobacco Use  . Smoking status: Never Smoker  . Smokeless tobacco: Never Used  Substance Use Topics  . Alcohol use: Yes    Comment: occasional wine  . Drug use: No    No Known Allergies  Current Meds  Medication Sig  . [DISCONTINUED] HYDROcodone-acetaminophen (NORCO/VICODIN) 5-325 MG tablet Take one tab po q 4 hrs prn pain    BP (!) 188/103   Pulse 78   Ht 6\' 3"  (1.905 m)   Wt 275 lb (124.7 kg)   BMI 34.37 kg/m   Physical Exam Vitals signs reviewed.  Constitutional:      Appearance: Normal appearance. He is well-developed.  Skin:    General: Skin is warm and  dry.     Findings: No erythema.  Neurological:     Mental Status: He is alert and oriented to person, place, and time.  Psychiatric:        Mood and Affect: Mood and affect normal.     Ortho Exam  Right elbow no tenderness full range of motion feels very stable muscle tone and strength normal neurovascular intact skin clean  Left elbow bruising and ecchymosis over the medial collateral ligament and elbow with tenderness and quite a bit of swelling.  Range of motion is 5-1 20 medial collateral ligament stress tests are normal tenderness over the lateral joint line neurovascular exam is intact  MEDICAL DECISION SECTION  Xrays were done at Hospital  My independent reading of xrays:  Avulsion fracture capitellum  Encounter Diagnoses  Name Primary?  . Closed fracture of capitulum of left humerus, initial encounter Yes  . Elbow sprain, ulnar collateral ligament, left, initial encounter     PLAN: (Rx., injectx, surgery, frx, mri/ct) Recommend no lifting for 3 weeks Sling as needed for comfort Active range of motion 4 times a day out of the sling Ibuprofen 803 times a day Norco every 6 5 mg for breakthrough pain  Follow-up 3 weeks  Meds ordered this encounter  Medications  .  HYDROcodone-acetaminophen (NORCO) 5-325 MG tablet    Sig: Take 1 tablet by mouth every 6 (six) hours as needed for up to 7 days for moderate pain.    Dispense:  28 tablet    Refill:  0  . ibuprofen (ADVIL,MOTRIN) 800 MG tablet    Sig: Take 1 tablet (800 mg total) by mouth every 8 (eight) hours as needed.    Dispense:  90 tablet    Refill:  0    Arther Abbott, MD  11/04/2018 3:59 PM

## 2018-11-14 ENCOUNTER — Encounter: Payer: Self-pay | Admitting: Gynecology

## 2018-11-14 ENCOUNTER — Ambulatory Visit
Admission: EM | Admit: 2018-11-14 | Discharge: 2018-11-14 | Disposition: A | Discharge status: Home / Self Care | Payer: BLUE CROSS/BLUE SHIELD | Attending: Emergency Medicine | Admitting: Emergency Medicine

## 2018-11-14 ENCOUNTER — Other Ambulatory Visit: Payer: Self-pay

## 2018-11-14 DIAGNOSIS — J019 Acute sinusitis, unspecified: Secondary | ICD-10-CM

## 2018-11-14 MED ORDER — FLUTICASONE PROPIONATE 50 MCG/ACT NA SUSP: 2.0000 | Freq: Every day | NASAL | 0 refills | Status: AC

## 2018-11-14 NOTE — ED Provider Notes (Signed)
HPI  SUBJECTIVE:  Devin Bradley is a 58 y.o. male who presents with 3 to 4 days of nasal congestion, sinus pain and pressure.  He reports occasional sneezing but denies other allergy symptoms.  He denies rhinorrhea, fevers, upper dental pain, facial swelling.  No sore throat, postnasal drip.  Has an occasional cough.  He tried cough syrup and an unknown nasal spray and Mucinex without much improvement of symptoms.  He is not doing any saline nasal irrigation nor is he on any nasal steroids.  No alleviating factors.  Symptoms are worse at night and with going out into the cold air.  Past medical history negative for allergies, diabetes, hypertension, frequent sinusitis.  PMD: Sharilyn Sites, MD     History reviewed. No pertinent past medical history.  Past Surgical History:  Procedure Laterality Date  . COLONOSCOPY N/A 11/05/2017   Procedure: COLONOSCOPY;  Surgeon: Daneil Dolin, MD;  [facility]: [facility];  Service: Endoscopy;  Laterality: N/A;  9:30  . POLYPECTOMY  11/05/2017   Procedure: POLYPECTOMY;  Surgeon: Daneil Dolin, MD;  [facility];  Service: Endoscopy;;  sigmoid    Family History  Problem Relation Age of Onset  . Cancer Father   . Cancer Sister   . Cancer Brother     Social History   Tobacco Use  . Smoking status: Never Smoker  . Smokeless tobacco: Never Used  Substance Use Topics  . Alcohol use: Yes    Comment: occasional wine  . Drug use: No    No current facility-administered medications for this encounter.   Current Outpatient Medications:  .  ibuprofen (ADVIL,MOTRIN) 800 MG tablet, Take 1 tablet (800 mg total) by mouth every 8 (eight) hours as needed., Disp: 90 tablet, Rfl: 0 .  Multiple Vitamin (MULTIVITAMIN) tablet, Take 1 tablet by mouth daily., Disp: , Rfl:  .  fluticasone (FLONASE) 50 MCG/ACT nasal spray, Place 2 sprays into both nostrils daily., Disp: 16 g, Rfl: 0 .  Omega-3 Fatty Acids (FISH OIL) 1000 MG CAPS, Take 1 capsule by mouth  daily. , Disp: , Rfl:   No Known Allergies   ROS  As noted in HPI.   Physical Exam  BP (!) 149/97 (BP Location: Left Arm)   Pulse 69   Temp 98.4 F (36.9 C) (Oral)   Resp 18   Wt 124.7 kg   SpO2 100%   BMI 34.37 kg/m   Constitutional: Well developed, well nourished, no acute distress Eyes:  EOMI, conjunctiva normal bilaterally HENT: Normocephalic, atraumatic,mucus membranes moist.  Mucoid nasal congestion.  Extensively erythematous, swollen turbinates.  No maxillary, frontal sinus tenderness. Respiratory: Normal inspiratory effort Cardiovascular: Normal rate GI: nondistended skin: No rash, skin intact Musculoskeletal: no deformities Neurologic: Alert & oriented x 3, no focal neuro deficits Psychiatric: Speech and behavior appropriate   ED Course   Medications - No data to display  No orders of the defined types were placed in this encounter.   No results found for this or any previous visit (from the past 24 hour(s)). No results found.  ED Clinical Impression  Acute non-recurrent sinusitis, unspecified location   ED Assessment/Plan  Suspect viral sinusitis.  No clear indications for antibiotics at this time.  Home with saline nasal irrigation, Flonase, Mucinex D, Afrin at night for no more than 3 days.  May follow-up here or see his primary care physician if still sick in 5 days, double sickening, fevers above 102 and we can consider antibiotics.  Discussed  MDM, treatment plan, and plan for follow-up with patient.. patient agrees with plan.   Meds ordered this encounter  Medications  . fluticasone (FLONASE) 50 MCG/ACT nasal spray    Sig: Place 2 sprays into both nostrils daily.    Dispense:  16 g    Refill:  0    *This clinic note was created using [employer]. Therefore, there may be occasional mistakes despite careful proofreading.   ?   Melynda Ripple, MD 11/14/18 [phone]

## 2018-11-14 NOTE — ED Triage Notes (Signed)
Patient c/o sinus problem. Per patient with head and nasal congestion.Marland Kitchen

## 2018-11-14 NOTE — Discharge Instructions (Addendum)
Take the medication as written.  You may use Afrin because of the extent of your nasal congestion.  Use it at night only and do not use it for more than 3 days.  Start Mucinex-D to keep the mucous thin and to decongest you.  Return to the ER if you get worse, have a fever >100.4, or for any concerns. You may take 600 mg of motrin with 1 gram of tylenol up to 3-4 times a day as needed for pain. This is an effective combination for pain.  Most sinus infections are viral and do not need antibiotics unless you have a high fever, have had this for 10 days, or you get better and then get sick again. Use a NeilMed sinus rinse as often as you want to to reduce nasal congestion. Follow the directions on the box.   Go to www.goodrx.com to look up your medications. This will give you a list of where you can find your prescriptions at the most affordable prices. Or you can ask the pharmacist what the cash price is. This is frequently cheaper than going through insurance.

## 2018-11-25 ENCOUNTER — Encounter: Payer: Self-pay | Admitting: Orthopedic Surgery

## 2018-11-25 ENCOUNTER — Ambulatory Visit (INDEPENDENT_AMBULATORY_CARE_PROVIDER_SITE_OTHER): Payer: No Typology Code available for payment source | Admitting: Orthopedic Surgery

## 2018-11-25 ENCOUNTER — Other Ambulatory Visit: Payer: Self-pay

## 2018-11-25 VITALS — Temp 97.6°F | Ht 75.0 in | Wt 275.0 lb

## 2018-11-25 DIAGNOSIS — S42452D Displaced fracture of lateral condyle of left humerus, subsequent encounter for fracture with routine healing: Secondary | ICD-10-CM | POA: Diagnosis not present

## 2018-11-25 NOTE — Patient Instructions (Signed)
Return to work in 2 weeks   VIRTUAL VISIT IN 2 WEEKS (pm)

## 2018-11-25 NOTE — Progress Notes (Signed)
Chief Complaint  Patient presents with  . Elbow Injury    left elbow fracture 11/01/18   58 year old male was at work landed on his left arm and elbow presented to the ER on 2 March complaining of left elbow pain x-ray showed a small avulsion fraction of the capitellum   He complains of left elbow pain and swelling medial lateral joint some loss of motion and his sling is not holding his arm well and he stopped using it.  He is on hydrocodone for pain   He has some swelling on the medial side a lot of bruising and some lateral pain   Mild discomfort currently initially had severe 10 out of 10 pain  PLAN: (Rx., injectx, surgery, frx, mri/ct) Recommend no lifting for 3 weeks Sling as needed for comfort Active range of motion 4 times a day out of the sling Ibuprofen 800 times a day Norco every 6 5 mg for breakthrough pain   Follow-up 3 weeks       Meds ordered this encounter  Medications  . HYDROcodone-acetaminophen (NORCO) 5-325 MG tablet      Sig: Take 1 tablet by mouth every 6 (six) hours as needed for up to 7 days for moderate pain.      Dispense:  28 tablet      Refill:  0  . ibuprofen (ADVIL,MOTRIN) 800 MG tablet      Sig: Take 1 tablet (800 mg total) by mouth every 8 (eight) hours as needed.      Dispense:  90 tablet      Refill:  0   Courage is improving he has some tenderness and pain over his medial collateral ligament which was injured at the time of his capitellum fracture  His range of motion is 3 degrees of extension full flexion, he is tender over the medial collateral ligament he has a firm endpoint on stress testing at 30 and 0 degrees versus the right side  His pronation supination has returned to normal  He has some weakness with flexion  Recommend hand weight flexion exercises for strengthening  Virtual visit follow-up in 2 weeks with plan to return to work in 2 weeks.

## 2018-11-28 ENCOUNTER — Other Ambulatory Visit: Payer: Self-pay | Admitting: Orthopedic Surgery

## 2018-11-28 DIAGNOSIS — S53442A Ulnar collateral ligament sprain of left elbow, initial encounter: Secondary | ICD-10-CM

## 2018-11-28 DIAGNOSIS — S42452A Displaced fracture of lateral condyle of left humerus, initial encounter for closed fracture: Secondary | ICD-10-CM

## 2018-12-07 DIAGNOSIS — S42453D Displaced fracture of lateral condyle of unspecified humerus, subsequent encounter for fracture with routine healing: Secondary | ICD-10-CM | POA: Insufficient documentation

## 2018-12-09 ENCOUNTER — Ambulatory Visit (INDEPENDENT_AMBULATORY_CARE_PROVIDER_SITE_OTHER): Payer: No Typology Code available for payment source | Admitting: Orthopedic Surgery

## 2018-12-09 ENCOUNTER — Other Ambulatory Visit: Payer: Self-pay

## 2018-12-09 ENCOUNTER — Encounter: Payer: Self-pay | Admitting: Orthopedic Surgery

## 2018-12-09 VITALS — Ht 75.0 in | Wt 275.0 lb

## 2018-12-09 DIAGNOSIS — S42452D Displaced fracture of lateral condyle of left humerus, subsequent encounter for fracture with routine healing: Secondary | ICD-10-CM | POA: Diagnosis not present

## 2018-12-09 NOTE — Patient Instructions (Signed)
20th April RTW

## 2018-12-09 NOTE — Progress Notes (Signed)
Progress Note   Patient ID: JOVANNE RIGGENBACH, male   DOB: 1961/05/17, 58 y.o.   MRN: [id number]   Chief Complaint  Patient presents with  . Elbow Injury    left elbow fracture 11/01/18 improving     58 year old male had a Worker's Compensation related injury left elbow involving the medial collateral ligament and the capitellum which was fractured  He was treated with range of motion exercises ibuprofen and hydrocodone and rest  He is returned for his follow-up visit he has no complaints    Review of Systems  Neurological: Negative for tingling.     No Known Allergies   Ht 6\' 3"  (1.905 m)   Wt 275 lb (124.7 kg)   BMI 34.37 kg/m   Physical Exam Vitals signs and nursing note reviewed.  Constitutional:      Appearance: Normal appearance.  Musculoskeletal:       Arms:  Neurological:     Mental Status: He is alert and oriented to person, place, and time.  Psychiatric:        Mood and Affect: Mood normal.      Medical decisions:   Data  Imaging:   na  Encounter Diagnosis  Name Primary?  . Closed fracture of capitulum of left humerus with routine healing, subsequent encounter 11/01/18 Yes    PLAN:   Resume work on 21 December 2018 Full activity Released no further appointments needed    Arther Abbott, MD 12/09/2018 9:39 AM

## 2018-12-14 ENCOUNTER — Telehealth: Payer: Self-pay | Admitting: Orthopedic Surgery

## 2018-12-14 NOTE — Telephone Encounter (Signed)
Fax sent 12/14/18 as requested to Fax # [phone] to patient's workers comp case manager, Lou Miner, RN [phone]) at [facility], indicating, per Dr Ruthe Mannan note - PPI rating of 0% "did not place on MMI until 12/2018" - copy sent to scanning.

## 2019-06-03 DIAGNOSIS — Z6834 Body mass index (BMI) 34.0-34.9, adult: Secondary | ICD-10-CM | POA: Diagnosis not present

## 2019-06-03 DIAGNOSIS — Z7689 Persons encountering health services in other specified circumstances: Secondary | ICD-10-CM | POA: Diagnosis not present

## 2019-06-03 DIAGNOSIS — E669 Obesity, unspecified: Secondary | ICD-10-CM | POA: Diagnosis not present

## 2019-06-15 DIAGNOSIS — Z23 Encounter for immunization: Secondary | ICD-10-CM | POA: Diagnosis not present

## 2019-07-20 DIAGNOSIS — E785 Hyperlipidemia, unspecified: Secondary | ICD-10-CM | POA: Diagnosis not present

## 2019-07-20 DIAGNOSIS — Z7689 Persons encountering health services in other specified circumstances: Secondary | ICD-10-CM | POA: Diagnosis not present

## 2019-07-20 DIAGNOSIS — Z6834 Body mass index (BMI) 34.0-34.9, adult: Secondary | ICD-10-CM | POA: Diagnosis not present

## 2019-07-20 DIAGNOSIS — R7301 Impaired fasting glucose: Secondary | ICD-10-CM | POA: Diagnosis not present

## 2019-07-20 DIAGNOSIS — E669 Obesity, unspecified: Secondary | ICD-10-CM | POA: Diagnosis not present

## 2019-08-10 DIAGNOSIS — E559 Vitamin D deficiency, unspecified: Secondary | ICD-10-CM | POA: Diagnosis not present

## 2019-08-10 DIAGNOSIS — E785 Hyperlipidemia, unspecified: Secondary | ICD-10-CM | POA: Diagnosis not present

## 2019-08-10 DIAGNOSIS — R7301 Impaired fasting glucose: Secondary | ICD-10-CM | POA: Diagnosis not present

## 2019-08-10 DIAGNOSIS — R972 Elevated prostate specific antigen [PSA]: Secondary | ICD-10-CM | POA: Diagnosis not present

## 2019-11-25 IMAGING — DX DG ELBOW COMPLETE 3+V*L*
4 series · 4 of 4 positions shown · non-contrast
Comparison: None.

CLINICAL DATA: Fall.  Left elbow pain.

EXAM:
LEFT ELBOW - COMPLETE 3+ VIEW

[elbow ap]
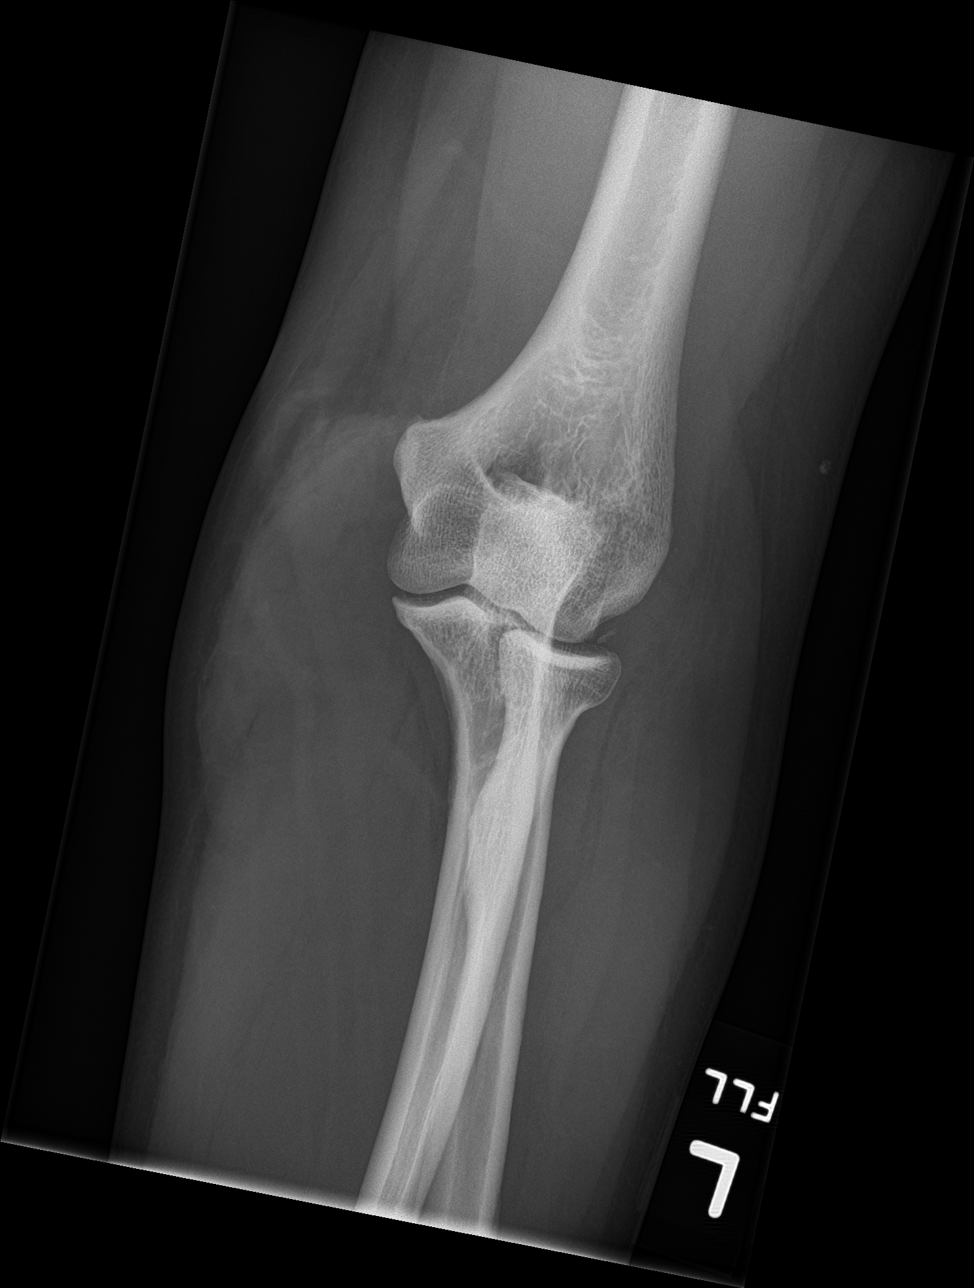

[elbow lat]
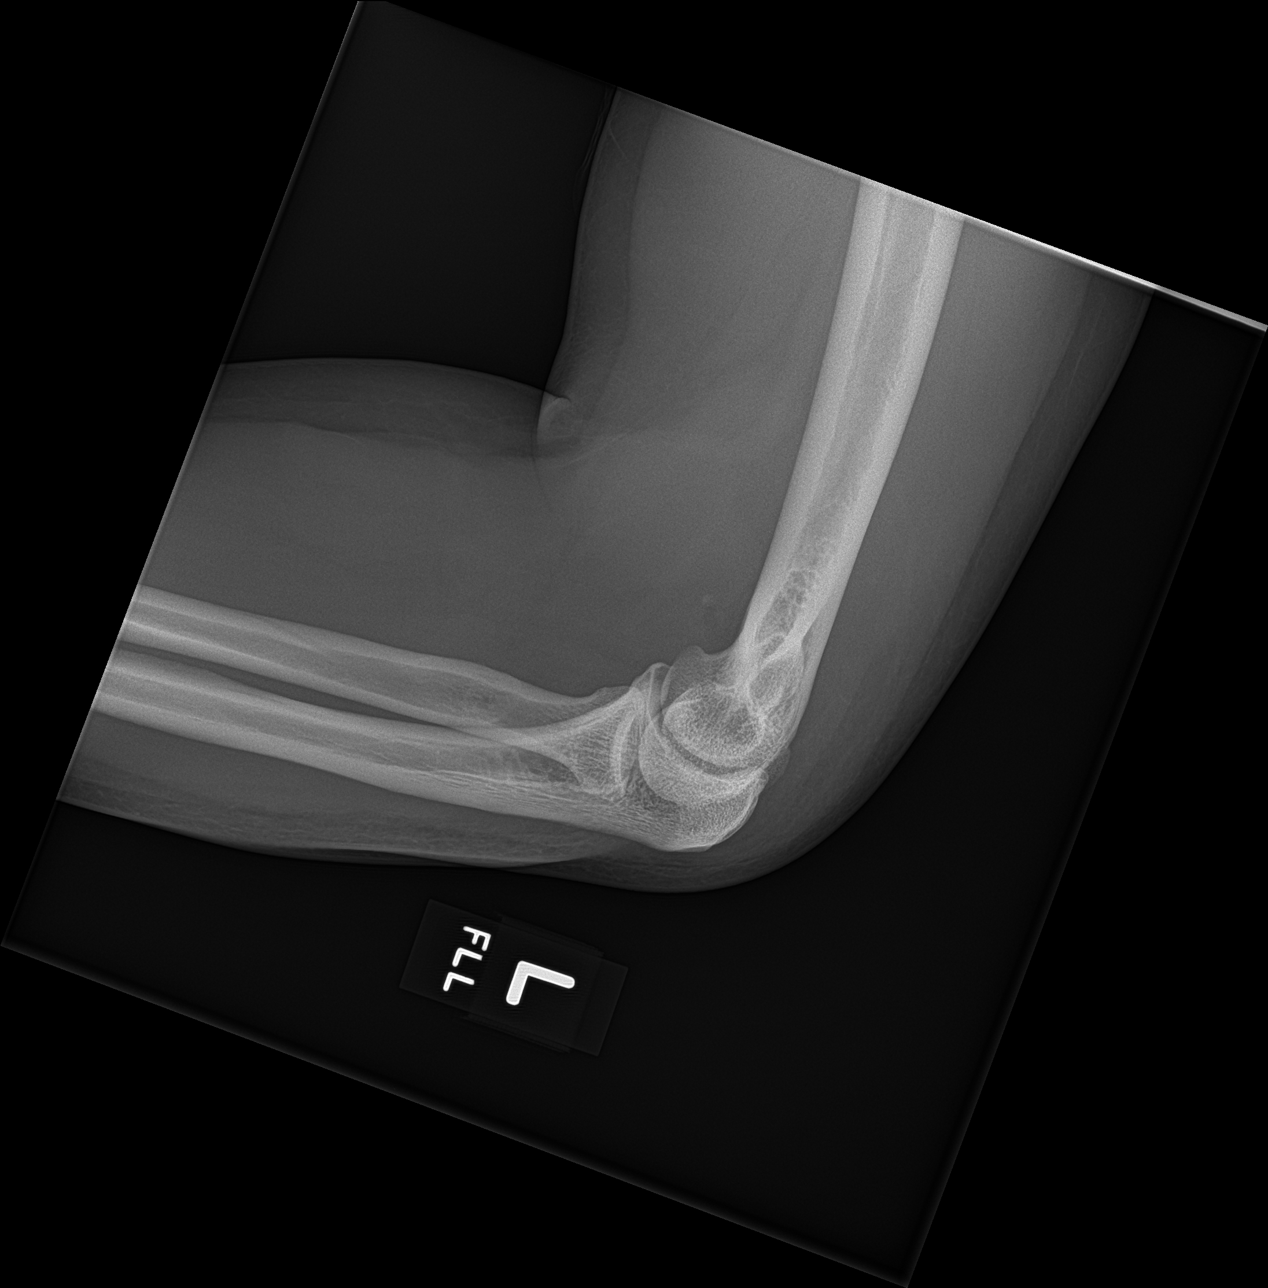

[elbow obl (1 of 2)]
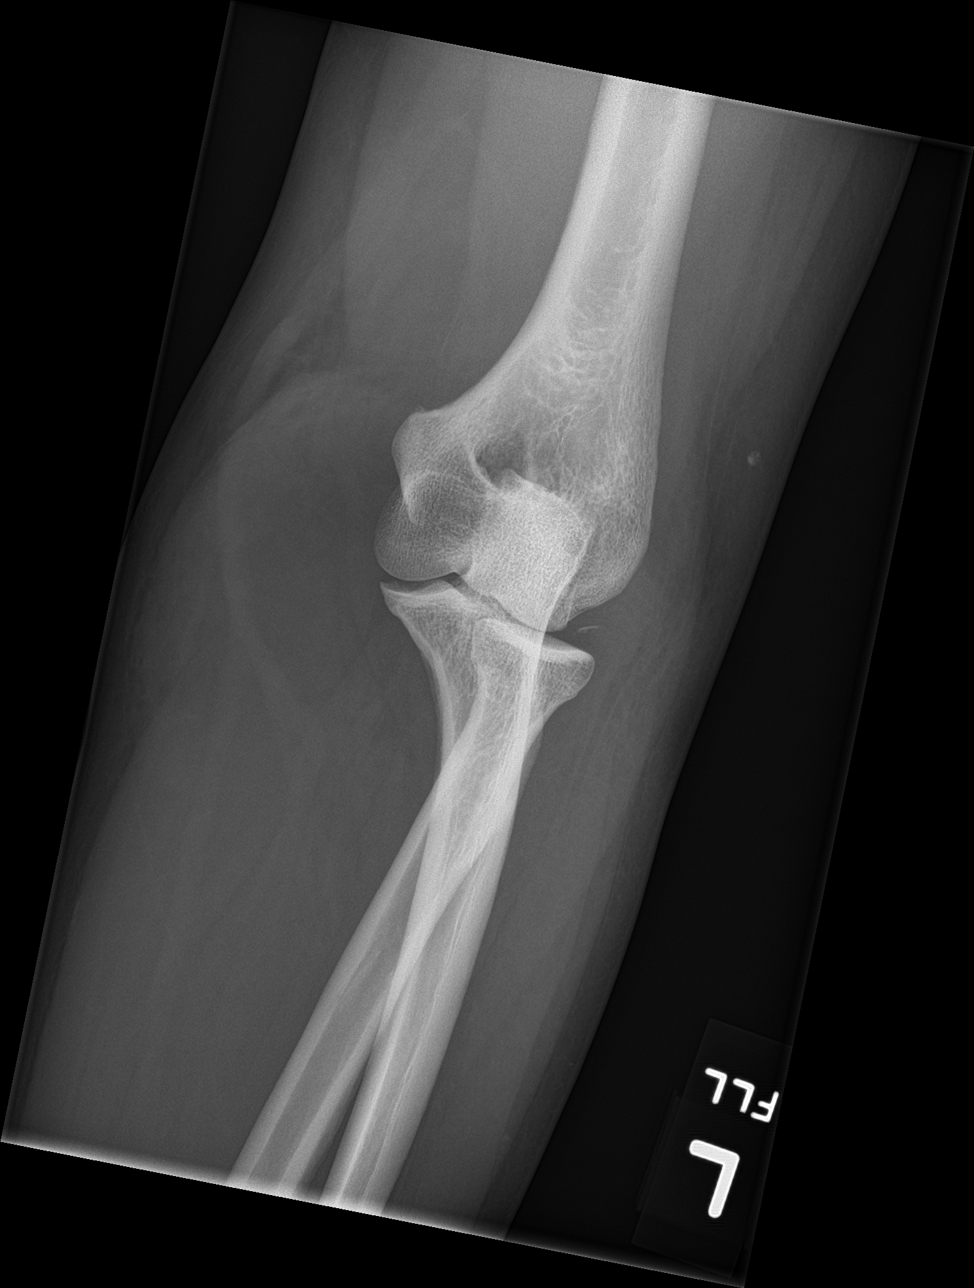

[elbow obl (2 of 2)]
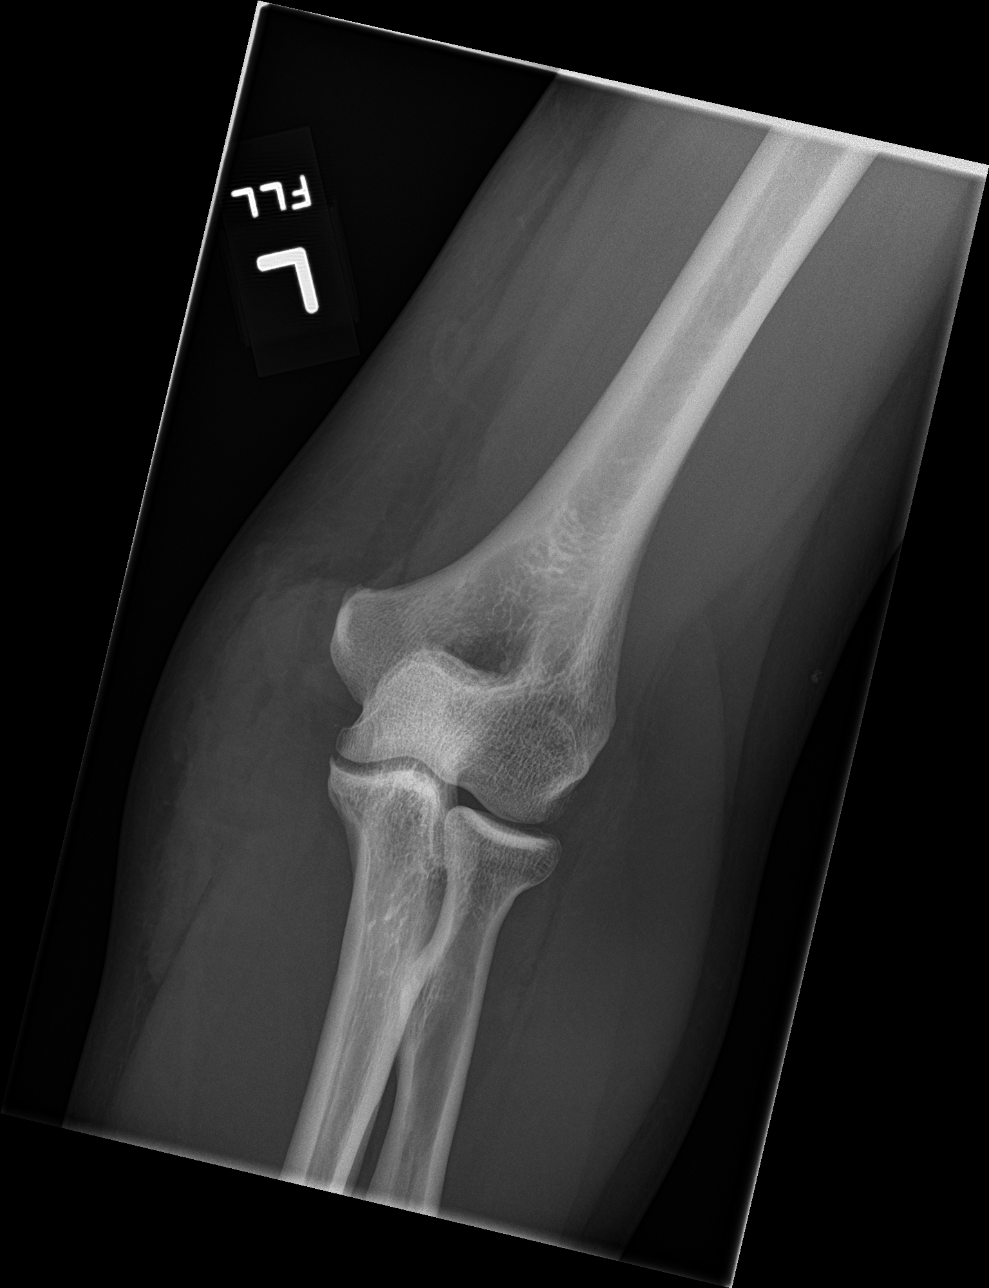

[4 of 4 positions shown; findings below may reference images not displayed]

FINDINGS: Small linear bone fragment near the capitellum and radial head.
Findings are suggestive for an avulsion fracture. There is some
irregularity along the lateral aspect of the capitellum suggesting
this could be the source of the fracture. No large joint effusion.
The elbow is located.
IMPRESSION: Evidence for an avulsion fracture near the capitellum and radial
head. Suspect that the fracture is originating from the lateral
aspect of the capitellum.

## 2019-12-04 ENCOUNTER — Ambulatory Visit: Payer: Self-pay | Attending: Internal Medicine

## 2019-12-04 DIAGNOSIS — Z23 Encounter for immunization: Secondary | ICD-10-CM

## 2019-12-04 NOTE — Progress Notes (Signed)
   [facility] Vaccination [facility]  Name:  Devin Bradley    MRN: [id number] DOB: [birth year]  12/04/2019  Mr. Heckman was observed post Covid-19 immunization for 15 minutes without incident. He was provided with Vaccine Information Sheet and instruction to access the V-Safe system.   Mr. Cannada was instructed to call 911 with any severe reactions post vaccine: Marland Kitchen Difficulty breathing  . Swelling of face and throat  . A fast heartbeat  . A bad rash all over body  . Dizziness and weakness   Immunizations Administered    Name Date Dose VIS Date Route   Pfizer COVID-19 Vaccine 12/04/2019 12:01 PM 0.3 mL 08/13/2019 Intramuscular   Manufacturer: [place]   Lot: [id number]   [place]: [id number]

## 2019-12-29 ENCOUNTER — Ambulatory Visit: Payer: Self-pay | Attending: Internal Medicine

## 2019-12-29 DIAGNOSIS — Z23 Encounter for immunization: Secondary | ICD-10-CM

## 2019-12-29 NOTE — Progress Notes (Signed)
   Covid-19 Vaccination Clinic  Name:  Devin Bradley    MRN: [id number] DOB: May 22, 1961  12/29/2019  Devin Bradley was observed post Covid-19 immunization for 15 minutes without incident. He was provided with Vaccine Information Sheet and instruction to access the V-Safe system.   Devin Bradley was instructed to call 911 with any severe reactions post vaccine: Marland Kitchen Difficulty breathing  . Swelling of face and throat  . A fast heartbeat  . A bad rash all over body  . Dizziness and weakness   Immunizations Administered    Name Date Dose VIS Date Route   Pfizer COVID-19 Vaccine 12/29/2019  3:30 PM 0.3 mL 10/27/2018 Intramuscular   Manufacturer: [place]   Lot: [id number]   [place]: [id number]

## 2020-06-13 DIAGNOSIS — Z23 Encounter for immunization: Secondary | ICD-10-CM | POA: Diagnosis not present

## 2020-07-20 DIAGNOSIS — Z1389 Encounter for screening for other disorder: Secondary | ICD-10-CM | POA: Diagnosis not present

## 2020-07-20 DIAGNOSIS — H6123 Impacted cerumen, bilateral: Secondary | ICD-10-CM | POA: Diagnosis not present

## 2020-07-20 DIAGNOSIS — Z6834 Body mass index (BMI) 34.0-34.9, adult: Secondary | ICD-10-CM | POA: Diagnosis not present

## 2020-07-20 DIAGNOSIS — Z0001 Encounter for general adult medical examination with abnormal findings: Secondary | ICD-10-CM | POA: Diagnosis not present

## 2020-07-20 DIAGNOSIS — E6609 Other obesity due to excess calories: Secondary | ICD-10-CM | POA: Diagnosis not present

## 2020-07-20 DIAGNOSIS — Z0000 Encounter for general adult medical examination without abnormal findings: Secondary | ICD-10-CM | POA: Diagnosis not present

## 2020-08-18 DIAGNOSIS — Z20822 Contact with and (suspected) exposure to covid-19: Secondary | ICD-10-CM | POA: Diagnosis not present

## 2021-03-28 DIAGNOSIS — M76822 Posterior tibial tendinitis, left leg: Secondary | ICD-10-CM | POA: Diagnosis not present

## 2021-03-28 DIAGNOSIS — Z6834 Body mass index (BMI) 34.0-34.9, adult: Secondary | ICD-10-CM | POA: Diagnosis not present

## 2021-03-28 DIAGNOSIS — E6609 Other obesity due to excess calories: Secondary | ICD-10-CM | POA: Diagnosis not present

## 2021-03-28 DIAGNOSIS — Z1331 Encounter for screening for depression: Secondary | ICD-10-CM | POA: Diagnosis not present

## 2021-06-14 DIAGNOSIS — Z23 Encounter for immunization: Secondary | ICD-10-CM | POA: Diagnosis not present

## 2022-10-09 ENCOUNTER — Encounter: Payer: Self-pay | Admitting: Internal Medicine

## 2022-10-16 ENCOUNTER — Encounter: Payer: Self-pay | Admitting: *Deleted
